# Patient Record
Sex: Female | Born: 1950 | Race: Black or African American | Hispanic: No | Marital: Single | Smoking: Never smoker
Health system: Southern US, Community
[De-identification: ages and names within clinical notes are randomized; demographics above are authoritative.]

## PROBLEM LIST (undated history)

## (undated) DIAGNOSIS — Z8619 Personal history of other infectious and parasitic diseases: Secondary | ICD-10-CM

## (undated) DIAGNOSIS — F450 Somatization disorder: Secondary | ICD-10-CM

## (undated) DIAGNOSIS — F419 Anxiety disorder, unspecified: Secondary | ICD-10-CM

## (undated) DIAGNOSIS — I10 Essential (primary) hypertension: Secondary | ICD-10-CM

## (undated) DIAGNOSIS — I219 Acute myocardial infarction, unspecified: Secondary | ICD-10-CM

## (undated) DIAGNOSIS — G459 Transient cerebral ischemic attack, unspecified: Secondary | ICD-10-CM

## (undated) DIAGNOSIS — F32A Depression, unspecified: Secondary | ICD-10-CM

## (undated) DIAGNOSIS — I2510 Atherosclerotic heart disease of native coronary artery without angina pectoris: Secondary | ICD-10-CM

## (undated) DIAGNOSIS — M654 Radial styloid tenosynovitis [de Quervain]: Secondary | ICD-10-CM

## (undated) DIAGNOSIS — R768 Other specified abnormal immunological findings in serum: Secondary | ICD-10-CM

## (undated) DIAGNOSIS — E785 Hyperlipidemia, unspecified: Secondary | ICD-10-CM

## (undated) DIAGNOSIS — M1990 Unspecified osteoarthritis, unspecified site: Secondary | ICD-10-CM

## (undated) DIAGNOSIS — G43909 Migraine, unspecified, not intractable, without status migrainosus: Secondary | ICD-10-CM

## (undated) DIAGNOSIS — E119 Type 2 diabetes mellitus without complications: Secondary | ICD-10-CM

## (undated) HISTORY — PX: COLONOSCOPY: SHX174

---
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
## 2001-12-09 ENCOUNTER — Emergency Department (HOSPITAL_COMMUNITY): Admission: EM | Admit: 2001-12-09 | Discharge: 2001-12-09 | Payer: Self-pay | Admitting: Emergency Medicine

## 2001-12-31 HISTORY — PX: CORONARY ARTERY BYPASS GRAFT: SHX141

## 2006-10-30 ENCOUNTER — Ambulatory Visit: Payer: Self-pay | Admitting: Internal Medicine

## 2006-12-16 ENCOUNTER — Inpatient Hospital Stay: Payer: Self-pay | Admitting: Internal Medicine

## 2006-12-16 ENCOUNTER — Other Ambulatory Visit: Payer: Self-pay

## 2006-12-16 ENCOUNTER — Ambulatory Visit: Payer: Self-pay | Admitting: Internal Medicine

## 2006-12-17 ENCOUNTER — Encounter: Payer: Self-pay | Admitting: Internal Medicine

## 2006-12-17 ENCOUNTER — Other Ambulatory Visit: Payer: Self-pay

## 2006-12-24 ENCOUNTER — Ambulatory Visit: Payer: Self-pay | Admitting: Internal Medicine

## 2007-04-19 ENCOUNTER — Emergency Department: Payer: Self-pay | Admitting: Emergency Medicine

## 2008-09-21 DIAGNOSIS — I2510 Atherosclerotic heart disease of native coronary artery without angina pectoris: Secondary | ICD-10-CM | POA: Insufficient documentation

## 2008-09-21 DIAGNOSIS — R079 Chest pain, unspecified: Secondary | ICD-10-CM

## 2008-09-21 DIAGNOSIS — R0602 Shortness of breath: Secondary | ICD-10-CM | POA: Insufficient documentation

## 2008-09-21 DIAGNOSIS — I10 Essential (primary) hypertension: Secondary | ICD-10-CM | POA: Insufficient documentation

## 2008-09-21 DIAGNOSIS — E785 Hyperlipidemia, unspecified: Secondary | ICD-10-CM | POA: Insufficient documentation

## 2008-09-21 DIAGNOSIS — E119 Type 2 diabetes mellitus without complications: Secondary | ICD-10-CM

## 2010-07-18 ENCOUNTER — Ambulatory Visit: Payer: Self-pay | Admitting: Ophthalmology

## 2010-07-30 ENCOUNTER — Ambulatory Visit: Payer: Self-pay | Admitting: Ophthalmology

## 2010-08-06 ENCOUNTER — Inpatient Hospital Stay: Payer: Self-pay | Admitting: Internal Medicine

## 2010-08-13 NOTE — Assessment & Plan Note (Signed)
[facility] OFFICE NOTE   MEKENZIE, MODESTE                       MRN:          [id number]  DATE:12/24/2006                            DOB:          15-Aug-1950    PRIMARY CARE PHYSICIAN:  Dr. Jeanie Cooks.   INTERVAL HISTORY:  Ms. Presti is a 60 year old woman with a history of  coronary artery disease status post previous bypass surgery,  hypertension, hyperlipidemia, and depression. She was recently admitted  to [facility] with chest pain concerning for unstable angina. She  ruled out for myocardial infarction. She underwent catheterization,  which actually showed minimal native coronary artery disease. Her two  vein grafts were occluded and her LIMA was atretic.  She had normal LV  function. She returns today for a routine follow up. She continues to  have some chest wall pain, which is worse when she lifts anything. She  has not had angina. Breathing has been okay. She has noticed some skin  desquamation at the cath site, but she has not had any other problems  there. She said that she does remain fatigued and is actually trying to  get disability.   CURRENT MEDICATIONS:  1. Aspirin 81 mg daily.  2. Plavix 75 mg daily.  3. Atenolol 25 mg daily.  4. Lescol 80 mg daily.  5. Zetia 10 mg daily.  6. Protonix 40 mg daily.  7. Cymbalta 60 mg daily.  8. Altace 10 mg daily.   PHYSICAL EXAMINATION:  GENERAL:  She is in no acute distress. She  ambulates around the clinic without any respiratory difficulty.  VITAL SIGNS:  Blood pressure is initially 158/98, manual recheck 178/98.  Heart rate 60, weight 165 pounds.  HEENT:  Normal.  NECK:  Supple. There is no JVD. Carotids are 2+ bilaterally without any  bruits. There is no lymphadenopathy or thyromegaly.  CARDIAC:  PMI is non-displaced. She has a regular rate and rhythm with a  2/6 tricuspid regurgitation murmur at the left sternal border that  increases with inspiration. There is no rub or gallop.  LUNGS:  Clear.  ABDOMEN:  Soft and nontender, nondistended. There is hepatosplenomegaly.  No bruits. No masses. Good bowel sounds. She does have pain to palpation  over her sternum.  EXTREMITIES:  Warm with no cyanosis, clubbing, or edema. No rash.  GROIN SITE:  Left groin site has some mild skin desquamation. There are  no bruits or hematoma.  NEUROLOGIC:  Alert and oriented x3. Cranial nerves II-XII are intact.  She can use all four extremities without difficulty. Affect is  flattened.   EKG shows sinus bradycardia with a septal infarct at a rate of 55.  Minimal T-wave flattening laterally.   ASSESSMENT AND PLAN:  1. Coronary artery disease. This is stable. Continue current therapy.  2. Hypertension. Blood pressure is significantly elevated. We will      discontinue her Atenolol and change her to Coreg 6.25 mg b.i.d. We      will titrate this as her heart rates tolerates it, but I suspect      that  she will also need either a diuretic or Norvasc at the next      visit.  3. Hyperlipidemia. This is followed by Dr. Vincenza Hews. Continue current      therapy. Need to titrate to make sure that her LDL stays under 70.     Bevelyn Buckles. Bensimhon, MD  Electronically Signed    DRB/MedQ  DD: 12/24/2006  DT: 12/25/2006  Job #: [id number]   cc:   Dr. Jeanie Cooks

## 2012-03-04 IMAGING — CR DG CHEST 1V PORT
1 series · 1 of 1 positions shown · non-contrast
Comparison: none

REASON FOR EXAM: chest pain
COMMENTS:

[view not recorded]
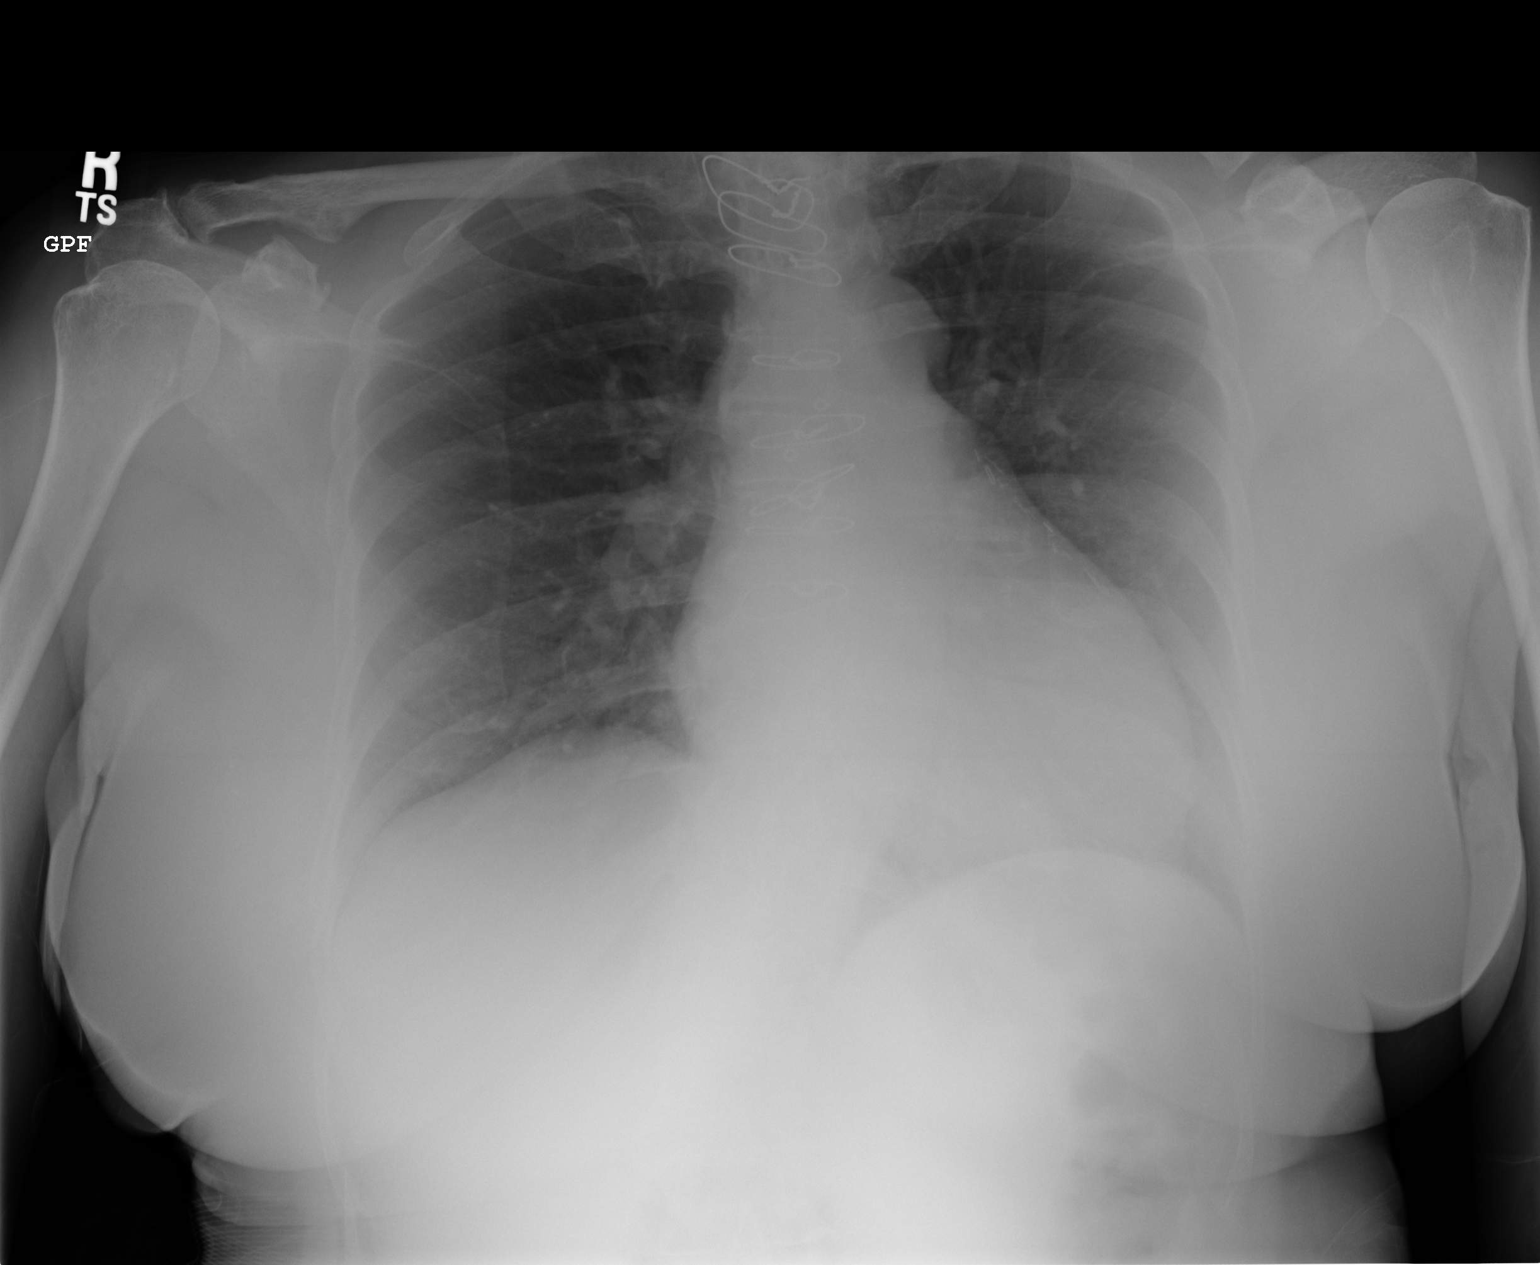

[1 of 1 positions shown; findings below may reference images not displayed]

PROCEDURE:     DXR - DXR PORTABLE CHEST SINGLE VIEW  - August 06, 2010  [DATE]

RESULT:     Comparison is made to a prior exam of 12/16/2006.

The lung fields are clear. The heart, mediastinal and osseous structures
reveal no significant abnormalities. Post operative changes of prior CABG
are again noted.
IMPRESSION: No acute changes are identified.

## 2012-03-04 IMAGING — CT CT HEAD WITHOUT CONTRAST
2 series · 16 of 30 positions shown, 20 images · non-contrast
Comparison: none

REASON FOR EXAM: headache, elevated bp
COMMENTS:

[Series 2: without · axial · non-contrast · 0.40mm/px · z∈[-134,-14]mm · 13 of 29 slices shown, 17 images]
[im 3/29  brain]
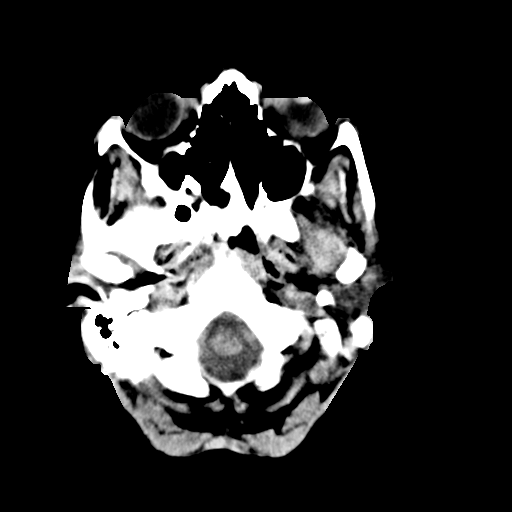
[im 3/29  bone]
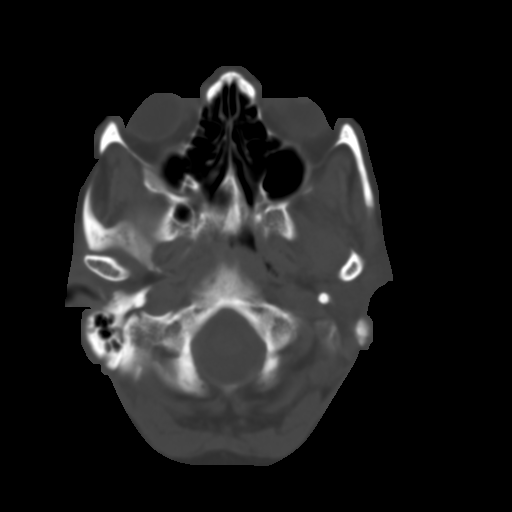
[im 5/29  brain]
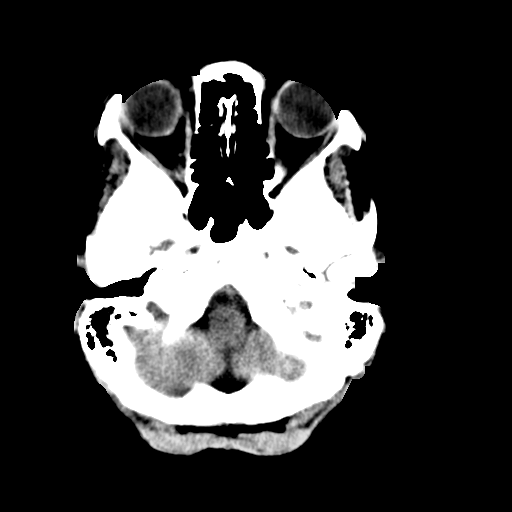
[im 7/29  brain]
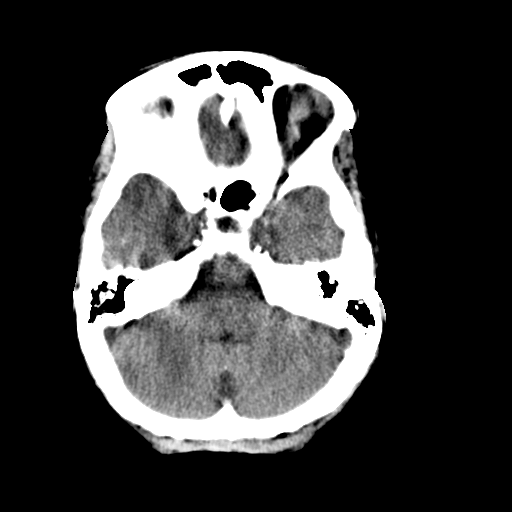
[im 9/29  brain]
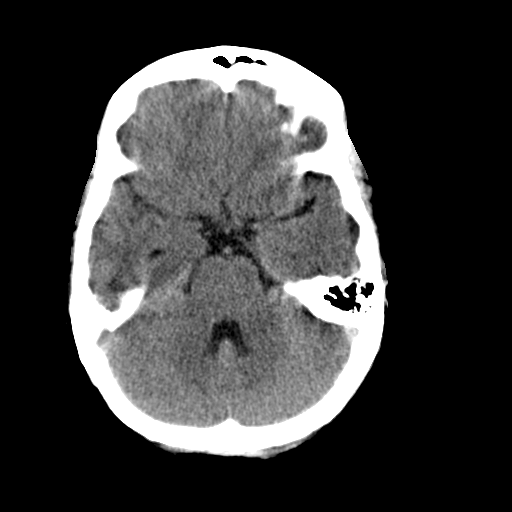
[im 11/29  brain]
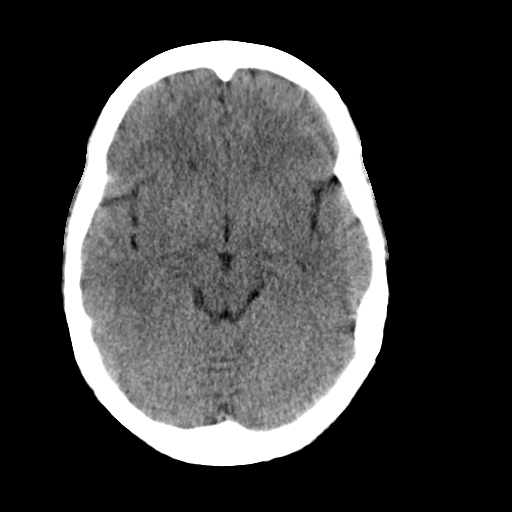
[im 11/29  bone]
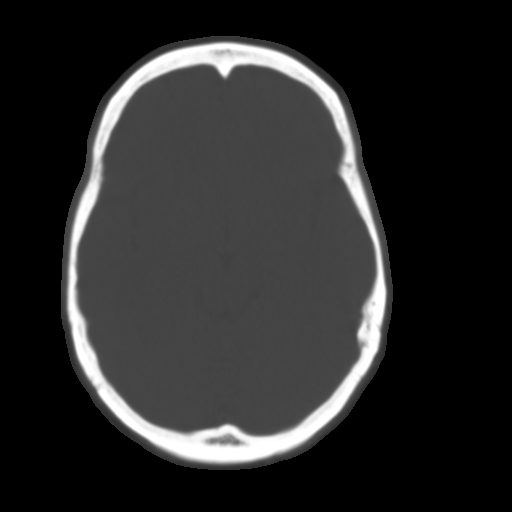
[im 13/29  brain]
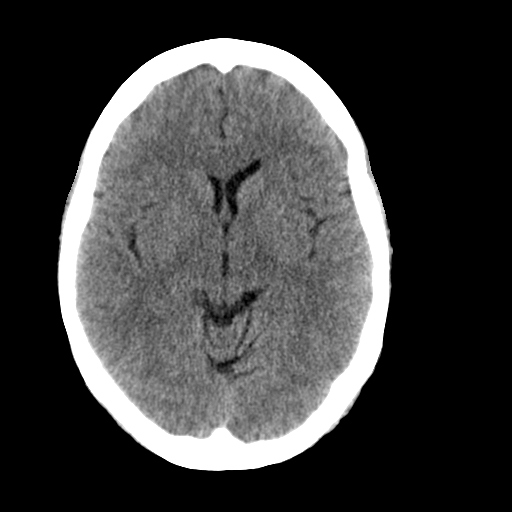
[im 15/29  brain]
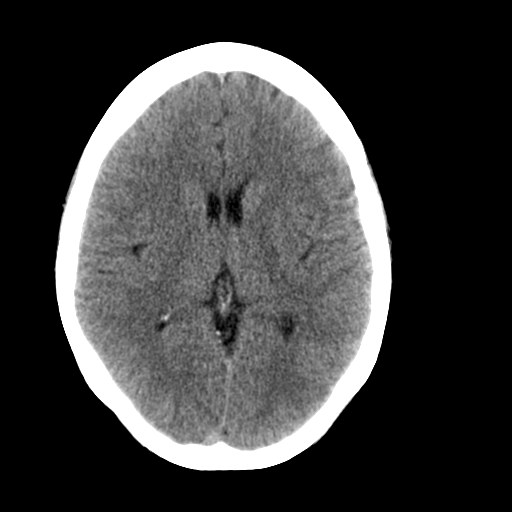
[im 17/29  brain]
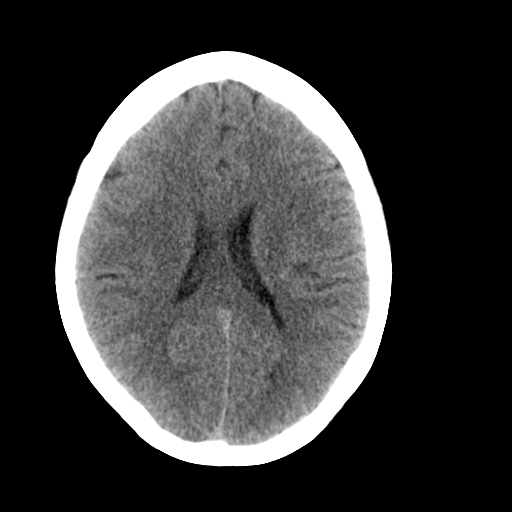
[im 19/29  brain]
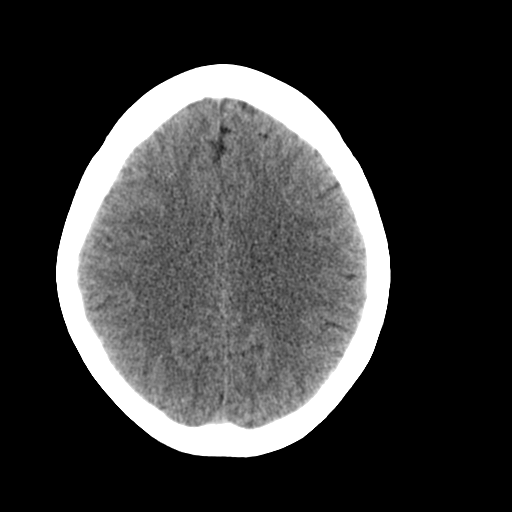
[im 19/29  bone]
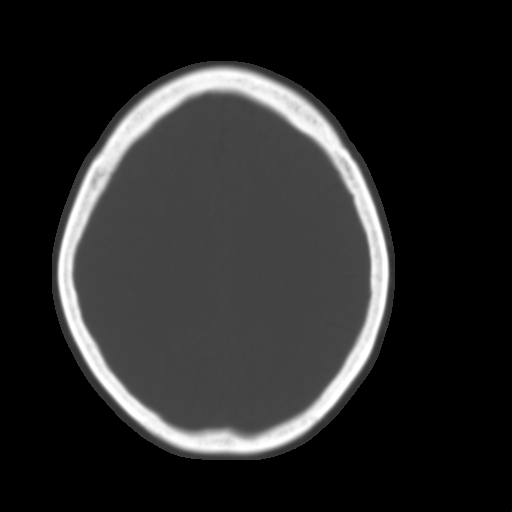
[im 21/29  brain]
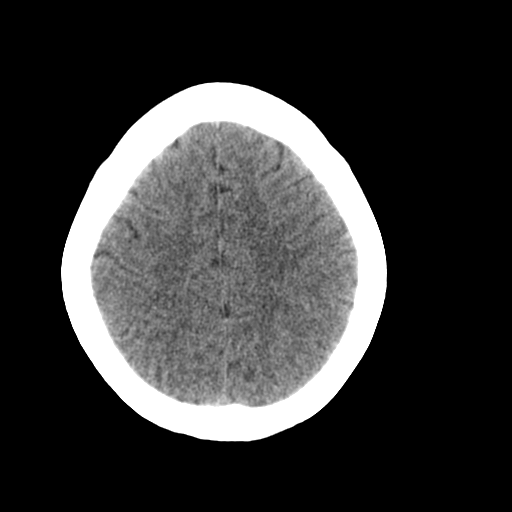
[im 23/29  brain]
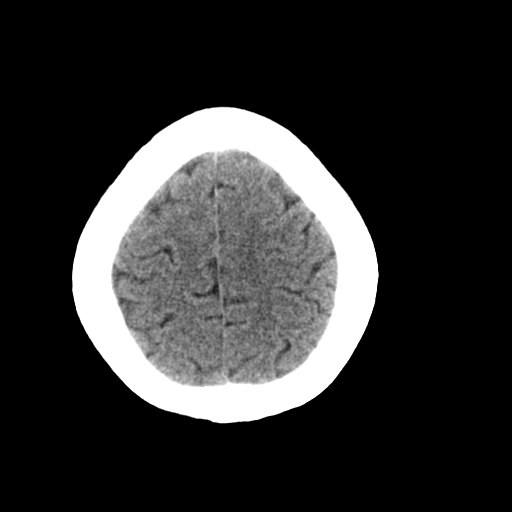
[im 25/29  brain]
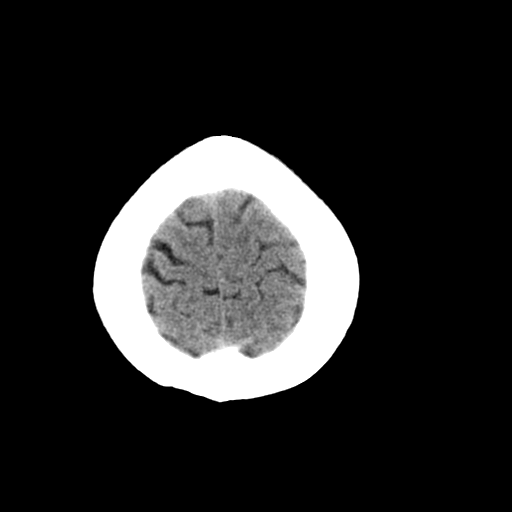
[im 27/29  brain]
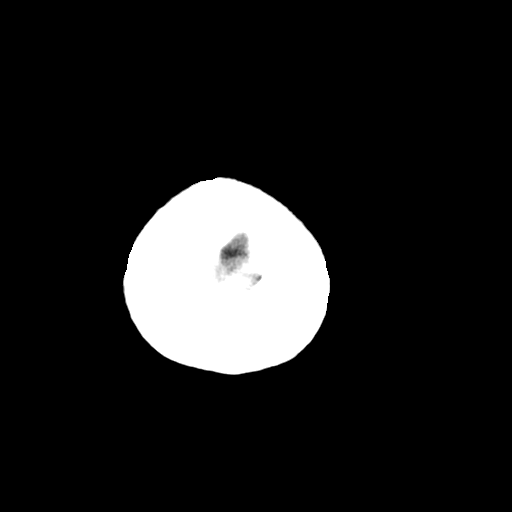
[im 27/29  bone]
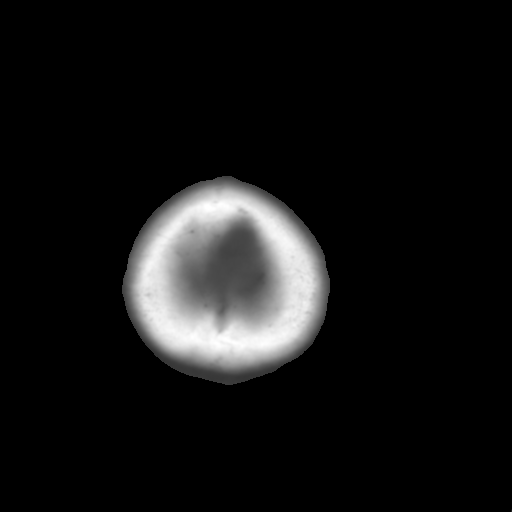

[Series 3: bone · axial · 0.40mm/px · z∈[-134,-94]mm · 3 of 29 slices shown]
[im 3/29  bone]
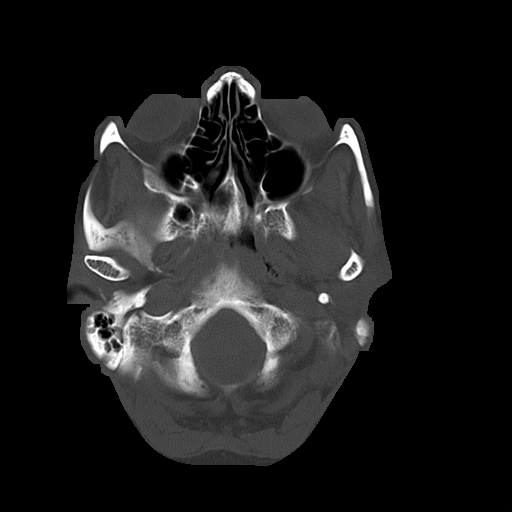
[im 7/29  bone]
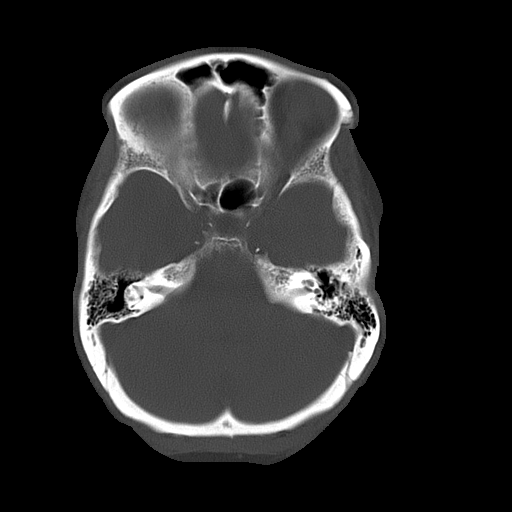
[im 11/29  bone]
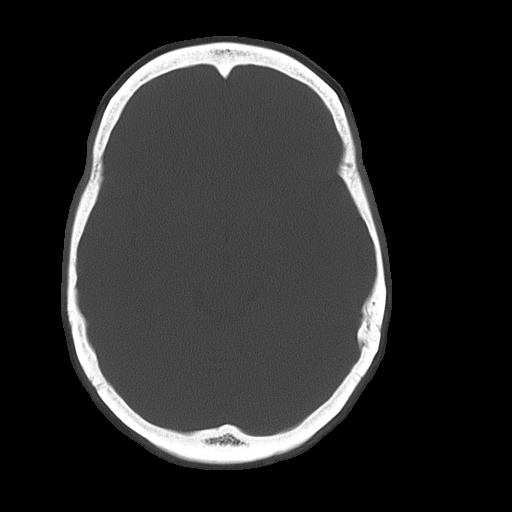

[16 of 30 positions shown; findings below may reference images not displayed]

PROCEDURE:     CT  - CT HEAD WITHOUT CONTRAST  - August 06, 2010 [DATE]

RESULT:     Emergent noncontrast CT of the brain is compared to previous
examination dated 04/19/2007.

The ventricles and sulci are normal. There is no hemorrhage. There is no
focal mass, mass-effect or midline shift. There is no evidence of edema or
territorial infarct. The bone windows demonstrate normal aeration of the
paranasal sinuses and mastoid air cells. There is no skull fracture
demonstrated.
IMPRESSION: 1. No acute intracranial abnormality.

## 2012-05-18 ENCOUNTER — Ambulatory Visit: Payer: Self-pay | Admitting: Ophthalmology

## 2019-03-04 ENCOUNTER — Other Ambulatory Visit: Payer: Self-pay

## 2019-03-04 DIAGNOSIS — Z20822 Contact with and (suspected) exposure to covid-19: Secondary | ICD-10-CM

## 2019-03-05 LAB — NOVEL CORONAVIRUS, NAA: SARS-CoV-2, NAA: NOT DETECTED

## 2019-03-16 ENCOUNTER — Telehealth: Payer: Self-pay

## 2019-03-16 NOTE — Telephone Encounter (Signed)
Pt notified of negative COVID-19 results. Understanding verbalized.  Chasta M Hopkins   

## 2023-01-20 LAB — COLOGUARD

## 2023-01-20 LAB — EXTERNAL GENERIC LAB PROCEDURE

## 2023-07-17 ENCOUNTER — Encounter: Payer: Self-pay | Admitting: Internal Medicine

## 2023-07-17 ENCOUNTER — Ambulatory Visit
Admission: RE | Admit: 2023-07-17 | Discharge: 2023-07-17 | Disposition: A | Discharge status: Home / Self Care | Attending: Internal Medicine | Admitting: Internal Medicine

## 2023-07-17 ENCOUNTER — Encounter
Admission: RE | Disposition: A | Discharge status: Home / Self Care | Payer: Self-pay | Source: Home / Self Care | Attending: Internal Medicine

## 2023-07-17 ENCOUNTER — Other Ambulatory Visit: Payer: Self-pay

## 2023-07-17 DIAGNOSIS — Z951 Presence of aortocoronary bypass graft: Secondary | ICD-10-CM | POA: Insufficient documentation

## 2023-07-17 DIAGNOSIS — E7800 Pure hypercholesterolemia, unspecified: Secondary | ICD-10-CM | POA: Diagnosis not present

## 2023-07-17 DIAGNOSIS — I2510 Atherosclerotic heart disease of native coronary artery without angina pectoris: Secondary | ICD-10-CM | POA: Diagnosis present

## 2023-07-17 DIAGNOSIS — I10 Essential (primary) hypertension: Secondary | ICD-10-CM | POA: Insufficient documentation

## 2023-07-17 DIAGNOSIS — E119 Type 2 diabetes mellitus without complications: Secondary | ICD-10-CM | POA: Diagnosis not present

## 2023-07-17 DIAGNOSIS — Z86718 Personal history of other venous thrombosis and embolism: Secondary | ICD-10-CM | POA: Diagnosis not present

## 2023-07-17 DIAGNOSIS — R9439 Abnormal result of other cardiovascular function study: Secondary | ICD-10-CM | POA: Diagnosis not present

## 2023-07-17 DIAGNOSIS — Z7984 Long term (current) use of oral hypoglycemic drugs: Secondary | ICD-10-CM | POA: Diagnosis not present

## 2023-07-17 DIAGNOSIS — R001 Bradycardia, unspecified: Secondary | ICD-10-CM | POA: Diagnosis not present

## 2023-07-17 HISTORY — PX: LEFT HEART CATH AND CORONARY ANGIOGRAPHY: CATH118249

## 2023-07-17 HISTORY — DX: Somatization disorder: F45.0

## 2023-07-17 HISTORY — DX: Personal history of other infectious and parasitic diseases: Z86.19

## 2023-07-17 HISTORY — DX: Hyperlipidemia, unspecified: E78.5

## 2023-07-17 HISTORY — DX: Essential (primary) hypertension: I10

## 2023-07-17 HISTORY — DX: Migraine, unspecified, not intractable, without status migrainosus: G43.909

## 2023-07-17 HISTORY — DX: Atherosclerotic heart disease of native coronary artery without angina pectoris: I25.10

## 2023-07-17 HISTORY — DX: Anxiety disorder, unspecified: F41.9

## 2023-07-17 HISTORY — DX: Depression, unspecified: F32.A

## 2023-07-17 LAB — CARDIAC CATHETERIZATION: Cath EF Quantitative: 50 %

## 2023-07-17 SURGERY — LEFT HEART CATH AND CORONARY ANGIOGRAPHY
Anesthesia: Moderate Sedation | Laterality: Left

## 2023-07-17 MED ORDER — VERAPAMIL HCL 2.5 MG/ML IV SOLN
INTRAVENOUS | Status: AC
  Filled 2023-07-17: qty 2

## 2023-07-17 MED ORDER — IOHEXOL 300 MG/ML  SOLN
INTRAMUSCULAR | Status: DC | PRN
  Administered 2023-07-17: 61 mL

## 2023-07-17 MED ORDER — LIDOCAINE HCL (PF) 1 % IJ SOLN
INTRAMUSCULAR | Status: DC | PRN
  Administered 2023-07-17: 5 mL

## 2023-07-17 MED ORDER — SODIUM CHLORIDE 0.9 % WEIGHT BASED INFUSION: 1.0000 mL/kg/h | INTRAVENOUS | Status: DC

## 2023-07-17 MED ORDER — MIDAZOLAM HCL 2 MG/2ML IJ SOLN
INTRAMUSCULAR | Status: DC | PRN
  Administered 2023-07-17: 1 mg via INTRAVENOUS

## 2023-07-17 MED ORDER — SODIUM CHLORIDE 0.9 % WEIGHT BASED INFUSION
3.0000 mL/kg/h | INTRAVENOUS | Status: AC
  Administered 2023-07-17: 3 mL/kg/h via INTRAVENOUS

## 2023-07-17 MED ORDER — MIDAZOLAM HCL 2 MG/2ML IJ SOLN
INTRAMUSCULAR | Status: AC
Start: 2023-07-17 — End: ?
  Filled 2023-07-17: qty 2

## 2023-07-17 MED ORDER — HEPARIN SODIUM (PORCINE) 1000 UNIT/ML IJ SOLN
INTRAMUSCULAR | Status: AC
  Filled 2023-07-17: qty 10

## 2023-07-17 MED ORDER — SODIUM CHLORIDE 0.9 % IV SOLN: 250.0000 mL | INTRAVENOUS | Status: DC | PRN

## 2023-07-17 MED ORDER — HEPARIN (PORCINE) IN NACL 1000-0.9 UT/500ML-% IV SOLN
INTRAVENOUS | Status: AC
  Filled 2023-07-17: qty 1000

## 2023-07-17 MED ORDER — FENTANYL CITRATE (PF) 100 MCG/2ML IJ SOLN
INTRAMUSCULAR | Status: DC | PRN
  Administered 2023-07-17: 25 ug via INTRAVENOUS

## 2023-07-17 MED ORDER — ASPIRIN 81 MG PO CHEW: 81.0000 mg | CHEWABLE_TABLET | ORAL | Status: DC

## 2023-07-17 MED ORDER — LIDOCAINE HCL 1 % IJ SOLN
INTRAMUSCULAR | Status: AC
Start: 2023-07-17 — End: ?
  Filled 2023-07-17: qty 20

## 2023-07-17 MED ORDER — FENTANYL CITRATE (PF) 100 MCG/2ML IJ SOLN
INTRAMUSCULAR | Status: AC
  Filled 2023-07-17: qty 2

## 2023-07-17 MED ORDER — HEPARIN (PORCINE) IN NACL 2000-0.9 UNIT/L-% IV SOLN
INTRAVENOUS | Status: DC | PRN
  Administered 2023-07-17: 1000 mL

## 2023-07-17 MED ORDER — SODIUM CHLORIDE 0.9% FLUSH: 3.0000 mL | Freq: Two times a day (BID) | INTRAVENOUS | Status: DC

## 2023-07-17 MED ORDER — SODIUM CHLORIDE 0.9% FLUSH: 3.0000 mL | INTRAVENOUS | Status: DC | PRN

## 2023-07-17 SURGICAL SUPPLY — 12 items
CATH INFINITI 5 FR MPA2 (CATHETERS) IMPLANT
CATH INFINITI 5FR MULTPACK ANG (CATHETERS) IMPLANT
DEVICE CLOSURE MYNXGRIP 5F (Vascular Products) IMPLANT
DRAPE BRACHIAL (DRAPES) IMPLANT
NDL PERC 18GX7CM (NEEDLE) IMPLANT
NEEDLE PERC 18GX7CM (NEEDLE) ×1 IMPLANT
PACK CARDIAC CATH (CUSTOM PROCEDURE TRAY) ×1 IMPLANT
PROTECTION STATION PRESSURIZED (MISCELLANEOUS) ×1 IMPLANT
SET ATX-X65L (MISCELLANEOUS) IMPLANT
SHEATH AVANTI 5FR X 11CM (SHEATH) IMPLANT
STATION PROTECTION PRESSURIZED (MISCELLANEOUS) IMPLANT
WIRE GUIDERIGHT .035X150 (WIRE) IMPLANT

## 2023-07-17 NOTE — Discharge Instructions (Addendum)
 Please continue to hold metformin for 48hrs post procedure. Be cautious of your diet and sugar levels at this time.

## 2023-12-07 ENCOUNTER — Encounter: Payer: Self-pay | Admitting: *Deleted

## 2023-12-15 ENCOUNTER — Ambulatory Visit
Admission: RE | Admit: 2023-12-15 | Discharge: 2023-12-15 | Disposition: A | Discharge status: Home / Self Care | Attending: Gastroenterology | Admitting: Gastroenterology

## 2023-12-15 ENCOUNTER — Encounter
Admission: RE | Disposition: A | Discharge status: Home / Self Care | Payer: Self-pay | Source: Home / Self Care | Attending: Gastroenterology

## 2023-12-15 ENCOUNTER — Ambulatory Visit: Admitting: Anesthesiology

## 2023-12-15 ENCOUNTER — Other Ambulatory Visit: Payer: Self-pay

## 2023-12-15 DIAGNOSIS — I10 Essential (primary) hypertension: Secondary | ICD-10-CM | POA: Insufficient documentation

## 2023-12-15 DIAGNOSIS — E785 Hyperlipidemia, unspecified: Secondary | ICD-10-CM | POA: Insufficient documentation

## 2023-12-15 DIAGNOSIS — Z7984 Long term (current) use of oral hypoglycemic drugs: Secondary | ICD-10-CM | POA: Insufficient documentation

## 2023-12-15 DIAGNOSIS — Z955 Presence of coronary angioplasty implant and graft: Secondary | ICD-10-CM | POA: Diagnosis not present

## 2023-12-15 DIAGNOSIS — I252 Old myocardial infarction: Secondary | ICD-10-CM | POA: Diagnosis not present

## 2023-12-15 DIAGNOSIS — D122 Benign neoplasm of ascending colon: Secondary | ICD-10-CM | POA: Insufficient documentation

## 2023-12-15 DIAGNOSIS — E119 Type 2 diabetes mellitus without complications: Secondary | ICD-10-CM | POA: Diagnosis not present

## 2023-12-15 DIAGNOSIS — Z7902 Long term (current) use of antithrombotics/antiplatelets: Secondary | ICD-10-CM | POA: Diagnosis not present

## 2023-12-15 DIAGNOSIS — I2510 Atherosclerotic heart disease of native coronary artery without angina pectoris: Secondary | ICD-10-CM | POA: Insufficient documentation

## 2023-12-15 DIAGNOSIS — Z1211 Encounter for screening for malignant neoplasm of colon: Secondary | ICD-10-CM | POA: Diagnosis present

## 2023-12-15 DIAGNOSIS — Z951 Presence of aortocoronary bypass graft: Secondary | ICD-10-CM | POA: Insufficient documentation

## 2023-12-15 HISTORY — DX: Radial styloid tenosynovitis (de quervain): M65.4

## 2023-12-15 HISTORY — DX: Unspecified osteoarthritis, unspecified site: M19.90

## 2023-12-15 HISTORY — DX: Acute myocardial infarction, unspecified: I21.9

## 2023-12-15 HISTORY — DX: Transient cerebral ischemic attack, unspecified: G45.9

## 2023-12-15 HISTORY — PX: COLONOSCOPY: SHX5424

## 2023-12-15 HISTORY — DX: Type 2 diabetes mellitus without complications: E11.9

## 2023-12-15 HISTORY — DX: Other specified abnormal immunological findings in serum: R76.8

## 2023-12-15 LAB — GLUCOSE, CAPILLARY: Glucose-Capillary: 104 mg/dL — ABNORMAL HIGH (ref 70–99)

## 2023-12-15 SURGERY — COLONOSCOPY
Anesthesia: General

## 2023-12-15 MED ORDER — PROPOFOL 10 MG/ML IV BOLUS
INTRAVENOUS | Status: DC | PRN
  Administered 2023-12-15: 40 mg via INTRAVENOUS
  Administered 2023-12-15 (×2): 20 mg via INTRAVENOUS
  Administered 2023-12-15: 30 mg via INTRAVENOUS
  Administered 2023-12-15 (×3): 20 mg via INTRAVENOUS

## 2023-12-15 MED ORDER — LIDOCAINE HCL (PF) 2 % IJ SOLN
INTRAMUSCULAR | Status: DC | PRN
  Administered 2023-12-15: 40 mg via INTRADERMAL

## 2023-12-15 MED ORDER — SODIUM CHLORIDE 0.9 % IV SOLN: INTRAVENOUS | Status: DC

## 2023-12-15 NOTE — Interval H&P Note (Signed)
 History and Physical Interval Note:  12/15/2023 12:07 PM  Stacey Herrera  has presented today for surgery, with the diagnosis of Hx of colon polyps.  The various methods of treatment have been discussed with the patient and family. After consideration of risks, benefits and other options for treatment, the patient has consented to  Procedure(s): COLONOSCOPY (N/A) as a surgical intervention.  The patient's history has been reviewed, patient examined, no change in status, stable for surgery.  I have reviewed the patient's chart and labs.  Questions were answered to the patient's satisfaction.     Ole ONEIDA Schick  Ok to proceed with colonoscopy

## 2023-12-15 NOTE — Transfer of Care (Signed)
 Immediate Anesthesia Transfer of Care Note  Patient: Stacey Herrera  Procedure(s) Performed: COLONOSCOPY  Patient Location: PACU  Anesthesia Type:MAC  Level of Consciousness: drowsy  Airway & Oxygen Therapy: Patient Spontanous Breathing and Patient connected to nasal cannula oxygen  Post-op Assessment: Report given to RN and Post -op Vital signs reviewed and stable  Post vital signs: Reviewed and stable  Last Vitals:  Vitals Value Taken Time  BP 114/83 12/15/23 12:32  Temp 36.1 C 12/15/23 12:32  Pulse 76 12/15/23 12:33  Resp 21 12/15/23 12:33  SpO2 100 % 12/15/23 12:33  Vitals shown include unfiled device data.  Last Pain:  Vitals:   12/15/23 1232  TempSrc: Temporal  PainSc:          Complications: No notable events documented.
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
## 2023-12-15 NOTE — H&P (Signed)
 Outpatient short stay form Pre-procedure 12/15/2023  Stacey ONEIDA Schick, MD  Primary Physician: Davis Krystal Kotyk, MD (Inactive)  Reason for visit:  Personal history of polyps  History of present illness:    73 y/o lady with history of CAD on plavix (last dose 7 days ago), hyperlipidemia, and hypertension here for colonoscopy due to history of polyps. Last colonoscopy in 2004 at [facility] with polyps of unknown type. No significant abdominal surgeries. No family history of GI malignancies.    Current Facility-Administered Medications:    0.9 %  sodium chloride  infusion, , Intravenous, Continuous, Merlyn Conley, Stacey ONEIDA, MD, Last Rate: 20 mL/hr at 12/15/23 1204, New Bag at 12/15/23 1204  Medications Prior to Admission  Medication Sig Dispense Refill Last Dose/Taking   amLODipine (NORVASC) 10 MG tablet Take 10 mg by mouth daily.   12/15/2023   aspirin  EC 81 MG tablet Take 81 mg by mouth daily. Swallow whole.   12/14/2023   atorvastatin (LIPITOR) 40 MG tablet Take 40 mg by mouth daily.   12/14/2023   Ca Phosphate-Cholecalciferol (CALCIUM/VITAMIN D3 GUMMIES) 200-5 MG-MCG CHEW Chew 2 tablets by mouth daily.   Past Week   celecoxib (CELEBREX) 200 MG capsule Take 200 mg by mouth daily.   12/14/2023   isosorbide mononitrate (IMDUR) 60 MG 24 hr tablet Take 60 mg by mouth daily.   12/14/2023   lisinopril (ZESTRIL) 10 MG tablet Take 10 mg by mouth daily.   12/15/2023   metFORMIN (GLUCOPHAGE) 500 MG tablet Take 500 mg by mouth 2 (two) times daily.   12/15/2023   metoprolol tartrate (LOPRESSOR) 25 MG tablet Take 25 mg by mouth 2 (two) times daily.   12/14/2023   traZODone (DESYREL) 150 MG tablet Take 150 mg by mouth at bedtime.   Past Week   venlafaxine XR (EFFEXOR-XR) 75 MG 24 hr capsule Take 75 mg by mouth daily.   Past Week   acetaminophen (TYLENOL) 650 MG CR tablet Take 650-1,300 mg by mouth every 8 (eight) hours as needed for pain.      clopidogrel (PLAVIX) 75 MG tablet Take 75 mg by mouth daily.    11/30/2023   nitroGLYCERIN (NITRODUR - DOSED IN MG/24 HR) 0.4 mg/hr patch Place 0.4 mg onto the skin daily as needed (chest pain).      nitroGLYCERIN (NITROSTAT) 0.4 MG SL tablet Place 0.4 mg under the tongue every 5 (five) minutes as needed for chest pain.      Oxycodone HCl 10 MG TABS Take 10 mg by mouth every 4 (four) hours as needed (pain).      Polyethyl Glycol-Propyl Glycol (SYSTANE OP) Place 1 drop into both eyes daily as needed (dry eyes).        Allergies  Allergen Reactions   Oxycodone-Acetaminophen     Unknown reaction     Past Medical History:  Diagnosis Date   ANA positive    Anxiety    Arthritis    Coronary artery disease    De Quervain's tenosynovitis, right    Depression    Diabetes mellitus without complication (HCC)    History of Lyme disease    Hyperlipidemia    Hypertension    Migraines    Myocardial infarction [facility])    Somatization disorder    TIA (transient ischemic attack)     Review of systems:  Otherwise negative.    Physical Exam  Gen: Alert, oriented. Appears stated age.  HEENT: PERRLA. Lungs: No respiratory distress CV: RRR Abd: soft, benign, no masses Ext:  No edema    Planned procedures: Proceed with colonoscopy. The patient understands the nature of the planned procedure, indications, risks, alternatives and potential complications including but not limited to bleeding, infection, perforation, damage to internal organs and possible oversedation/side effects from anesthesia. The patient agrees and gives consent to proceed.  Please refer to procedure notes for findings, recommendations and patient disposition/instructions.     Stacey ONEIDA Schick, MD [facility] Gastroenterology

## 2023-12-15 NOTE — Op Note (Signed)
 [facility] Gastroenterology Patient Name: Stacey Herrera Procedure Date: 12/15/2023 11:56 AM MRN: [id number] Account #: [id number] Date of Birth: 12/06/1950 Admit Type: Outpatient Age: 73 Room: [facility] ENDO ROOM 1 Gender: Female Note Status: Finalized Instrument Name: Colon Scope [id number] Procedure:             Colonoscopy Indications:           Surveillance: Personal history of colonic polyps                         (unknown histology) on last colonoscopy more than 5                         years ago Providers:             Ole Schick MD, MD Referring MD:          Krystal Pointer, MD (Referring MD) Medicines:             Monitored Anesthesia Care Complications:         No immediate complications. Estimated blood loss:                         Minimal. Procedure:             Pre-Anesthesia Assessment:                        - Prior to the procedure, a History and Physical was                         performed, and patient medications and allergies were                         reviewed. The patient is competent. The risks and                         benefits of the procedure and the sedation options and                         risks were discussed with the patient. All questions                         were answered and informed consent was obtained.                         Patient identification and proposed procedure were                         verified by the physician, the nurse, the                         anesthesiologist, the anesthetist and the technician                         in the endoscopy suite. Mental Status Examination:                         alert and oriented. Airway Examination: normal  oropharyngeal airway and neck mobility. Respiratory                         Examination: clear to auscultation. CV Examination:                         normal. Prophylactic Antibiotics: The patient does not                          require prophylactic antibiotics. Prior                         Anticoagulants: The patient has taken Plavix                         (clopidogrel), last dose was 7 days prior to                         procedure. ASA Grade Assessment: III - A patient with                         severe systemic disease. After reviewing the risks and                         benefits, the patient was deemed in satisfactory                         condition to undergo the procedure. The anesthesia                         plan was to use monitored anesthesia care (MAC).                         Immediately prior to administration of medications,                         the patient was re-assessed for adequacy to receive                         sedatives. The heart rate, respiratory rate, oxygen                         saturations, blood pressure, adequacy of pulmonary                         ventilation, and response to care were monitored                         throughout the procedure. The physical status of the                         patient was re-assessed after the procedure.                        After obtaining informed consent, the colonoscope was                         passed under direct vision. Throughout the procedure,  the patient's blood pressure, pulse, and oxygen                         saturations were monitored continuously. The                         Colonoscope was introduced through the anus and                         advanced to the the terminal ileum, with                         identification of the appendiceal orifice and IC                         valve. The colonoscopy was somewhat difficult due to a                         redundant colon. Successful completion of the                         procedure was aided by applying abdominal pressure.                         The patient tolerated the procedure well. The quality                         of the bowel  preparation was good. The terminal ileum,                         ileocecal valve, appendiceal orifice, and rectum were                         photographed. Findings:      The perianal and digital rectal examinations were normal.      The terminal ileum appeared normal.      Two sessile polyps were found in the ascending colon. The polyps were 3       to 4 mm in size. These polyps were removed with a cold snare. Resection       and retrieval were complete. Estimated blood loss was minimal.      The exam was otherwise without abnormality on direct and retroflexion       views. Impression:            - The examined portion of the ileum was normal.                        - Two 3 to 4 mm polyps in the ascending colon, removed                         with a cold snare. Resected and retrieved.                        - The examination was otherwise normal on direct and                         retroflexion views. Recommendation:        - Discharge patient  to home.                        - Resume previous diet.                        - Resume Plavix (clopidogrel) at prior dose today.                        - Await pathology results.                        - Repeat colonoscopy is not recommended due to current                         age (43 years or older) for surveillance.                        - Return to referring physician as previously                         scheduled. Procedure Code(s):     --- Professional ---                        [phone], Colonoscopy, flexible; with removal of                         tumor(s), polyp(s), or other lesion(s) by snare                         technique Diagnosis Code(s):     --- Professional ---                        Z86.010, Personal history of colonic polyps                        D12.2, Benign neoplasm of ascending colon CPT copyright 2022 American Medical Association. All rights reserved. The codes documented in this report are preliminary and upon  coder review may  be revised to meet current compliance requirements. Ole Schick MD, MD 12/15/2023 12:32:52 PM Number of Addenda: 0 Note Initiated On: 12/15/2023 11:56 AM Scope Withdrawal Time: 0 hours 10 minutes 3 seconds  Total Procedure Duration: 0 hours 16 minutes 9 seconds  Estimated Blood Loss:  Estimated blood loss was minimal.      [facility]

## 2023-12-16 LAB — SURGICAL PATHOLOGY

## 2023-12-16 NOTE — Anesthesia Postprocedure Evaluation (Signed)
 Anesthesia Post Note  Patient: Stacey Herrera  Procedure(s) Performed: COLONOSCOPY  Patient location during evaluation: Endoscopy Anesthesia Type: General Level of consciousness: awake and alert Pain management: pain level controlled Vital Signs Assessment: post-procedure vital signs reviewed and stable Respiratory status: spontaneous breathing, nonlabored ventilation, respiratory function stable and patient connected to nasal cannula oxygen Cardiovascular status: blood pressure returned to baseline and stable Postop Assessment: no apparent nausea or vomiting Anesthetic complications: no   No notable events documented.   Last Vitals:  Vitals:   12/15/23 1242 12/15/23 1252  BP: 117/70 (!) 164/89  Pulse: 77 66  Resp: (!) 21 14  Temp:    SpO2: 100% 100%    Last Pain:  Vitals:   12/15/23 1252  TempSrc:   PainSc: 0-No pain                 [name]
